# Patient Record
Sex: Female | Born: 1985 | Race: White | Hispanic: No | Marital: Single | Smoking: Never smoker
Health system: Southern US, Community
[De-identification: ages and names within clinical notes are randomized; demographics above are authoritative.]

## PROBLEM LIST (undated history)

## (undated) DIAGNOSIS — J45909 Unspecified asthma, uncomplicated: Secondary | ICD-10-CM

## (undated) DIAGNOSIS — O169 Unspecified maternal hypertension, unspecified trimester: Secondary | ICD-10-CM

---

## 2014-09-25 ENCOUNTER — Encounter (HOSPITAL_COMMUNITY): Payer: Self-pay

## 2014-09-25 ENCOUNTER — Emergency Department (HOSPITAL_COMMUNITY): Payer: Medicaid Other

## 2014-09-25 ENCOUNTER — Emergency Department (HOSPITAL_COMMUNITY)
Admission: EM | Admit: 2014-09-25 | Discharge: 2014-09-25 | Disposition: A | Discharge status: Home / Self Care | Payer: Medicaid Other | Attending: Emergency Medicine | Admitting: Emergency Medicine

## 2014-09-25 DIAGNOSIS — O10911 Unspecified pre-existing hypertension complicating pregnancy, first trimester: Secondary | ICD-10-CM | POA: Insufficient documentation

## 2014-09-25 DIAGNOSIS — O99511 Diseases of the respiratory system complicating pregnancy, first trimester: Secondary | ICD-10-CM | POA: Diagnosis not present

## 2014-09-25 DIAGNOSIS — Z79899 Other long term (current) drug therapy: Secondary | ICD-10-CM | POA: Diagnosis not present

## 2014-09-25 DIAGNOSIS — Z3A01 Less than 8 weeks gestation of pregnancy: Secondary | ICD-10-CM | POA: Insufficient documentation

## 2014-09-25 DIAGNOSIS — O039 Complete or unspecified spontaneous abortion without complication: Secondary | ICD-10-CM | POA: Insufficient documentation

## 2014-09-25 DIAGNOSIS — O219 Vomiting of pregnancy, unspecified: Secondary | ICD-10-CM | POA: Insufficient documentation

## 2014-09-25 DIAGNOSIS — N939 Abnormal uterine and vaginal bleeding, unspecified: Secondary | ICD-10-CM

## 2014-09-25 DIAGNOSIS — J45909 Unspecified asthma, uncomplicated: Secondary | ICD-10-CM | POA: Diagnosis not present

## 2014-09-25 DIAGNOSIS — O034 Incomplete spontaneous abortion without complication: Secondary | ICD-10-CM

## 2014-09-25 HISTORY — DX: Unspecified asthma, uncomplicated: J45.909

## 2014-09-25 HISTORY — DX: Unspecified maternal hypertension, unspecified trimester: O16.9

## 2014-09-25 LAB — CBC WITH DIFFERENTIAL/PLATELET
Basophils Absolute: 0 10*3/uL (ref 0.0–0.1)
Basophils Relative: 0 % (ref 0–1)
Eosinophils Absolute: 0.1 10*3/uL (ref 0.0–0.7)
Eosinophils Relative: 1 % (ref 0–5)
HEMATOCRIT: 37.6 % (ref 36.0–46.0)
HEMOGLOBIN: 13 g/dL (ref 12.0–15.0)
LYMPHS ABS: 2.7 10*3/uL (ref 0.7–4.0)
LYMPHS PCT: 28 % (ref 12–46)
MCH: 29.3 pg (ref 26.0–34.0)
MCHC: 34.6 g/dL (ref 30.0–36.0)
MCV: 84.9 fL (ref 78.0–100.0)
MONOS PCT: 5 % (ref 3–12)
Monocytes Absolute: 0.5 10*3/uL (ref 0.1–1.0)
NEUTROS PCT: 66 % (ref 43–77)
Neutro Abs: 6.3 10*3/uL (ref 1.7–7.7)
Platelets: 288 10*3/uL (ref 150–400)
RBC: 4.43 MIL/uL (ref 3.87–5.11)
RDW: 12.9 % (ref 11.5–15.5)
WBC: 9.6 10*3/uL (ref 4.0–10.5)

## 2014-09-25 LAB — WET PREP, GENITAL
CLUE CELLS WET PREP: NONE SEEN
TRICH WET PREP: NONE SEEN
WBC WET PREP: NONE SEEN
Yeast Wet Prep HPF POC: NONE SEEN

## 2014-09-25 LAB — ABO/RH: ABO/RH(D): A POS

## 2014-09-25 LAB — HCG, QUANTITATIVE, PREGNANCY: hCG, Beta Chain, Quant, S: 4451 m[IU]/mL — ABNORMAL HIGH (ref <5)

## 2014-09-25 NOTE — Discharge Instructions (Signed)
Your HCG quant today is 4451 which was resulted on 09/25/2014 at 7:41pm  Please maintain your follow-up with your primary doctor so that they can continue to follow your HCG and monitor your vaginal bleeding. If the bleeding significantly worsens, you develop significant abdominal pain or have other concerning findings return to the nearest emergency room for further evaluation.       US OB Limited (Final result) Result time: 09/25/14 22:43:16   Procedure changed from US Pelvis Complete      Final result by Rad Results In Interface (09/25/14 22:43:16)   Narrative:   CLINICAL DATA: Vaginal bleeding since 2 o'clock today.  EXAM: OBSTETRIC <14 WK [place] AND TRANSVAGINAL OB US  TECHNIQUE: Both transabdominal and transvaginal ultrasound examinations were performed for complete evaluation of the gestation as well as the maternal uterus, adnexal regions, and pelvic cul-de-sac. Transvaginal technique was performed to assess early pregnancy.  COMPARISON: None.  FINDINGS: Intrauterine gestational sac: Heterogeneous complex cystic structure in the endometrial cavity with any irregular contour.  Yolk sac: Not visualized  Embryo: Not visualized  Cardiac Activity: Not visualized  MSD: 19.1 mm  6 w  6 d  Maternal uterus/adnexae: No adnexal mass. Small hypoechoic area adjacent to the state back suggesting a a subchorionic hemorrhage. No adnexal mass. Left ovary is not visualized. Normal right ovary.  IMPRESSION: 1. Heterogeneous complex cystic structure in the endometrial cavity with irregular contour were. Findings are suspicious but not yet definitive for failed pregnancy. Recommend follow-up US in 10-14 days and serial beta HCG for definitive diagnosis. This recommendation follows SRU consensus guidelines: Diagnostic Criteria for Nonviable Pregnancy Early in the First Trimester. Malva Limes Med 2013; 542:7062-37.   Electronically Signed By: Elige Ko On:  09/25/2014 22:43          US OB Transvaginal (Final result) Result time: 09/25/14 22:43:16   Final result by Rad Results In Interface (09/25/14 22:43:16)   Narrative:   CLINICAL DATA: Vaginal bleeding since 2 o'clock today.  EXAM: OBSTETRIC <14 WK [place] AND TRANSVAGINAL OB US  TECHNIQUE: Both transabdominal and transvaginal ultrasound examinations were performed for complete evaluation of the gestation as well as the maternal uterus, adnexal regions, and pelvic cul-de-sac. Transvaginal technique was performed to assess early pregnancy.  COMPARISON: None.  FINDINGS: Intrauterine gestational sac: Heterogeneous complex cystic structure in the endometrial cavity with any irregular contour.  Yolk sac: Not visualized  Embryo: Not visualized  Cardiac Activity: Not visualized  MSD: 19.1 mm  6 w  6 d  Maternal uterus/adnexae: No adnexal mass. Small hypoechoic area adjacent to the state back suggesting a a subchorionic hemorrhage. No adnexal mass. Left ovary is not visualized. Normal right ovary.  IMPRESSION: 1. Heterogeneous complex cystic structure in the endometrial cavity with irregular contour were. Findings are suspicious but not yet definitive for failed pregnancy. Recommend follow-up US in 10-14 days and serial beta HCG for definitive diagnosis. This recommendation follows SRU consensus guidelines: Diagnostic Criteria for Nonviable Pregnancy Early in the First Trimester. Malva Limes Med 2013; 628:3151-76.

## 2014-09-25 NOTE — ED Notes (Signed)
Resident at bedside.  

## 2014-09-25 NOTE — ED Notes (Addendum)
Pt was helping sister move and is from [place]. Started passing clots today vaginally and is [redacted] weeks pregnant. Has been spotting this pregnancy which is 2nd pregnancy for her. Also reports some lower pelvic mid abd cramping. 1/10 pain scale. Has an appt tomorrow for u/s to determine EDD. States she has had some spotting when conceived and they are really sure when due date may be.

## 2014-09-25 NOTE — ED Provider Notes (Signed)
CSN: [id number]     Arrival date & time 09/25/14  1907 History   First MD Initiated Contact with Patient 09/25/14 2026     Chief Complaint  Patient presents with  . Vaginal Bleeding    [redacted] weeks pregnant     (Consider location/radiation/quality/duration/timing/severity/associated sxs/prior Treatment) Patient is a 29 y.o. female presenting with vaginal bleeding. The history is provided by the patient. No language interpreter was used.  Vaginal Bleeding Quality:  Clots Severity:  Moderate Onset quality:  Sudden Duration:  1 day Timing:  Intermittent Progression:  Worsening Chronicity:  New Possible pregnancy: yes (confirmed, followed by [facility] in [place])   Context: at rest and spontaneously   Relieved by:  None tried Worsened by:  Nothing tried Ineffective treatments:  None tried Associated symptoms: no abdominal pain, no dizziness, no dysuria, no fever, no nausea and no vaginal discharge   Risk factors: no hx of ectopic pregnancy, no new sexual partner and no prior miscarriage     Patient presents today with new onset vaginal bleeding that started over the last 1 week and worsened this afternoon. Patient relates prior to this afternoon she was having intermittent spotting. She states that today she started to feel like she was having one of her periods. She states that she is passing bright red clots currently. She denies any abdominal pain. Patient is notably confirmed pregnant and estimates her gestational age by last missed her period in mid June at 9 weeks. She denies any prior history of miscarriage and she is a G2 P1 pregnancy.  Past Medical History  Diagnosis Date  . Asthma   . Hypertension in pregnancy     1st pregnancy   History reviewed. No pertinent past surgical history. History reviewed. No pertinent family history. Social History  Substance Use Topics  . Smoking status: Never Smoker   . Smokeless tobacco: None  . Alcohol Use: No   OB History    Gravida Para Term Preterm AB TAB SAB Ectopic Multiple Living   2 1        1      Review of Systems  Constitutional: Negative for fever.  Gastrointestinal: Negative for nausea and abdominal pain.  Genitourinary: Positive for vaginal bleeding. Negative for dysuria and vaginal discharge.  Neurological: Negative for dizziness.      Allergies  Clindamycin/lincomycin  Home Medications   Prior to Admission medications   Medication Sig Start Date End Date Taking? Authorizing Provider  Prenatal Vit-Fe Fumarate-FA (PRENATAL PO) Take 1 tablet by mouth daily.   Yes Historical Provider, MD   BP 114/74 mmHg  Pulse 94  Temp(Src) 97.8 F (36.6 C) (Oral)  Resp 16  Ht 5\' 2"  (1.575 m)  Wt 193 lb 8 oz (87.771 kg)  BMI 35.38 kg/m2  SpO2 98% Physical Exam  Constitutional: She is oriented to person, place, and time. She appears well-developed and well-nourished. No distress.  HENT:  Head: Normocephalic and atraumatic.  Eyes: Conjunctivae and EOM are normal.  Neck: Normal range of motion. Neck supple.  Cardiovascular: Normal rate and regular rhythm.   No murmur heard. Pulmonary/Chest: Effort normal and breath sounds normal. No respiratory distress. She has no wheezes.  Abdominal: Soft. She exhibits no distension. There is no tenderness.  Genitourinary: Cervix exhibits no motion tenderness. Right adnexum displays no mass, no tenderness and no fullness. Left adnexum displays no mass, no tenderness and no fullness. There is bleeding (large clots) in the vagina. No tenderness in the vagina. No vaginal discharge  found.  Large amount of clots in vaginal vault. Some clots expressed from cervix. Cervix seems to be approximately 1 cm dilated on digital examination.  Musculoskeletal: Normal range of motion. She exhibits no edema.  Neurological: She is alert and oriented to person, place, and time.  Skin: Skin is warm and dry. She is not diaphoretic.  Psychiatric: She has a normal mood and affect. Her  behavior is normal.    ED Course  Procedures (including critical care time) Labs Review Labs Reviewed  HCG, QUANTITATIVE, PREGNANCY - Abnormal; Notable for the following:    hCG, Beta Chain, Quant, S 4451 (*)    All other components within normal limits  WET PREP, GENITAL  CBC WITH DIFFERENTIAL/PLATELET  ABO/RH  GC/CHLAMYDIA PROBE AMP ([place]) NOT AT [facility]    Imaging Review US Ob Limited  09/25/2014   CLINICAL DATA:  Vaginal bleeding since 2 o'clock today.  EXAM: OBSTETRIC <14 WK [place] AND TRANSVAGINAL OB US  TECHNIQUE: Both transabdominal and transvaginal ultrasound examinations were performed for complete evaluation of the gestation as well as the maternal uterus, adnexal regions, and pelvic cul-de-sac. Transvaginal technique was performed to assess early pregnancy.  COMPARISON:  None.  FINDINGS: Intrauterine gestational sac: Heterogeneous complex cystic structure in the endometrial cavity with any irregular contour.  Yolk sac:  Not visualized  Embryo:  Not visualized  Cardiac Activity: Not visualized  MSD: 19.1  mm   6 w   6  d  Maternal uterus/adnexae: No adnexal mass. Small hypoechoic area adjacent to the state back suggesting a a subchorionic hemorrhage. No adnexal mass. Left ovary is not visualized. Normal right ovary.  IMPRESSION: 1. Heterogeneous complex cystic structure in the endometrial cavity with irregular contour were. Findings are suspicious but not yet definitive for failed pregnancy. Recommend follow-up US in 10-14 days and serial beta HCG for definitive diagnosis. This recommendation follows SRU consensus guidelines: Diagnostic Criteria for Nonviable Pregnancy Early in the First Trimester. Malva Limes Med 2013; 161:0960-45.   Electronically Signed   By: Elige Ko   On: 09/25/2014 22:43   US Ob Transvaginal  09/25/2014   CLINICAL DATA:  Vaginal bleeding since 2 o'clock today.  EXAM: OBSTETRIC <14 WK [place] AND TRANSVAGINAL OB US  TECHNIQUE: Both transabdominal and transvaginal  ultrasound examinations were performed for complete evaluation of the gestation as well as the maternal uterus, adnexal regions, and pelvic cul-de-sac. Transvaginal technique was performed to assess early pregnancy.  COMPARISON:  None.  FINDINGS: Intrauterine gestational sac: Heterogeneous complex cystic structure in the endometrial cavity with any irregular contour.  Yolk sac:  Not visualized  Embryo:  Not visualized  Cardiac Activity: Not visualized  MSD: 19.1  mm   6 w   6  d  Maternal uterus/adnexae: No adnexal mass. Small hypoechoic area adjacent to the state back suggesting a a subchorionic hemorrhage. No adnexal mass. Left ovary is not visualized. Normal right ovary.  IMPRESSION: 1. Heterogeneous complex cystic structure in the endometrial cavity with irregular contour were. Findings are suspicious but not yet definitive for failed pregnancy. Recommend follow-up US in 10-14 days and serial beta HCG for definitive diagnosis. This recommendation follows SRU consensus guidelines: Diagnostic Criteria for Nonviable Pregnancy Early in the First Trimester. Malva Limes Med 2013; 409:8119-14.   Electronically Signed   By: Elige Ko   On: 09/25/2014 22:43   I have personally reviewed and evaluated these images and lab results as part of my medical decision-making.   EKG Interpretation  None      MDM   Final diagnoses:  Vaginal bleeding  Inevitable abortion    Patient presents today with vaginal bleeding that started today in the setting of known pregnancy. Ddx includes ectopic, threatened/inevitable abortion, infection. Pelvic exam appears consistent with inevitable abortion and os was approximately 1 cm dilated. Ultrasound shows findings concerning for this as well. Rh +, no indication for rhogam. Will get follow-up tomorrow with [facility] medicine in [place], [place] for further evaluation and trending of HCG which was notably between 4,000-5,000. Pt was HDS, no findings concerning for orthostatic  hypotension. No tachycardia. Pt comfortable with plan and aware of findings today. She was discharged home in good condition.     Madolyn Frieze, MD 09/26/14 1610  Dione Booze, MD 09/27/14 [phone]

## 2014-09-26 LAB — GC/CHLAMYDIA PROBE AMP (~~LOC~~) NOT AT ARMC
Chlamydia: NEGATIVE
NEISSERIA GONORRHEA: NEGATIVE

## 2016-04-29 IMAGING — US US OB LIMITED
1 series · 13 of 28 positions shown · non-contrast
Comparison: None.

CLINICAL DATA: Vaginal bleeding since 2 o'clock today.

EXAM:
OBSTETRIC <14 WK US AND TRANSVAGINAL OB US
TECHNIQUE: Both transabdominal and transvaginal ultrasound examinations were
performed for complete evaluation of the gestation as well as the
maternal uterus, adnexal regions, and pelvic cul-de-sac.
Transvaginal technique was performed to assess early pregnancy.

[Series 1: us ob limited · 0.20mm/px · 13 of 33 slices shown]
[im 2/33]
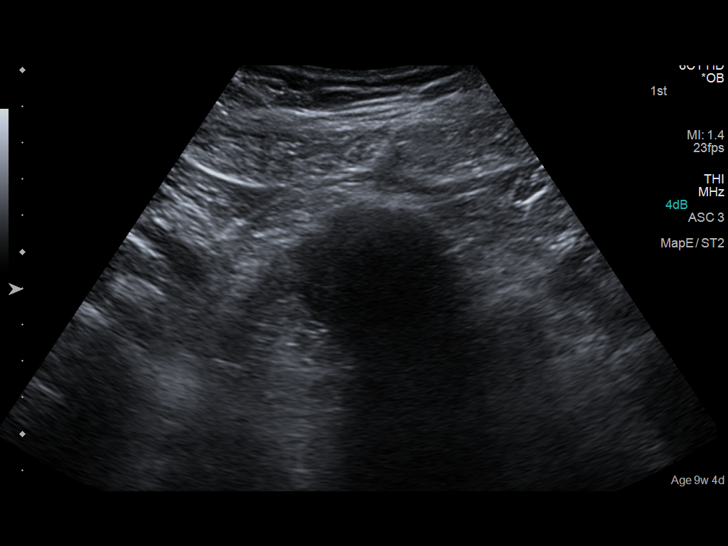
[im 4/33]
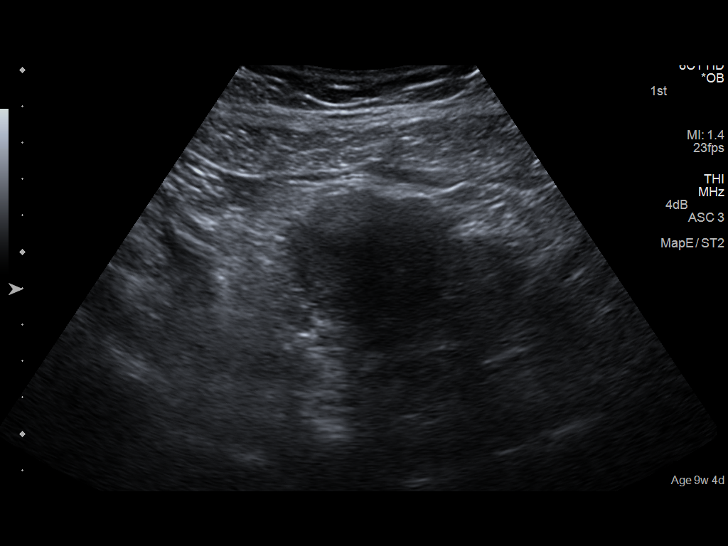
[im 6/33]
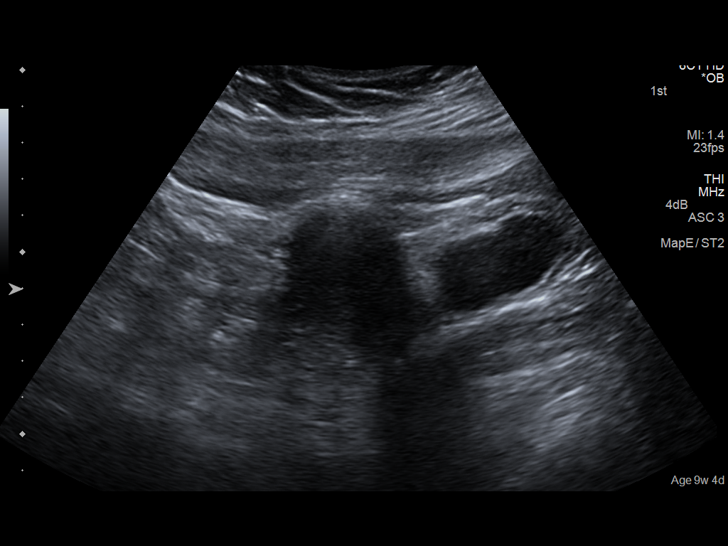
[im 9/33]
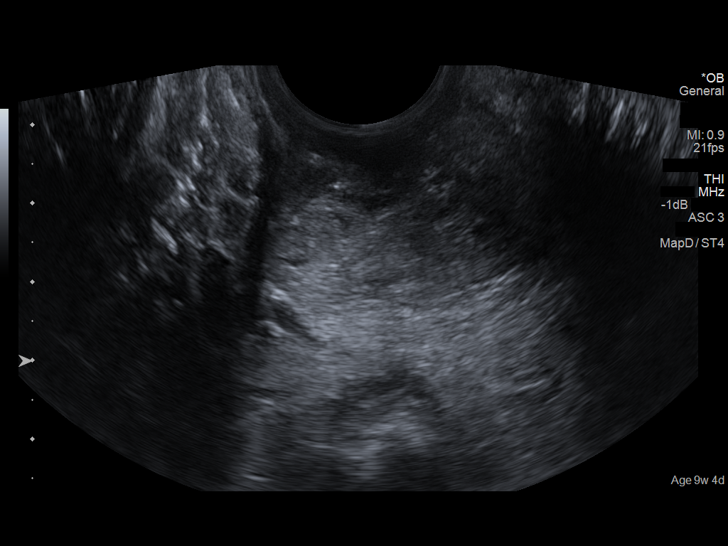
[im 11/33]
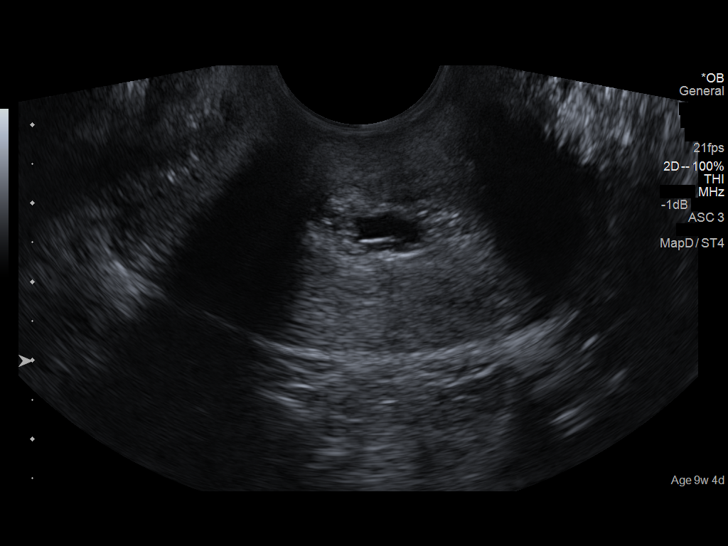
[im 14/33]
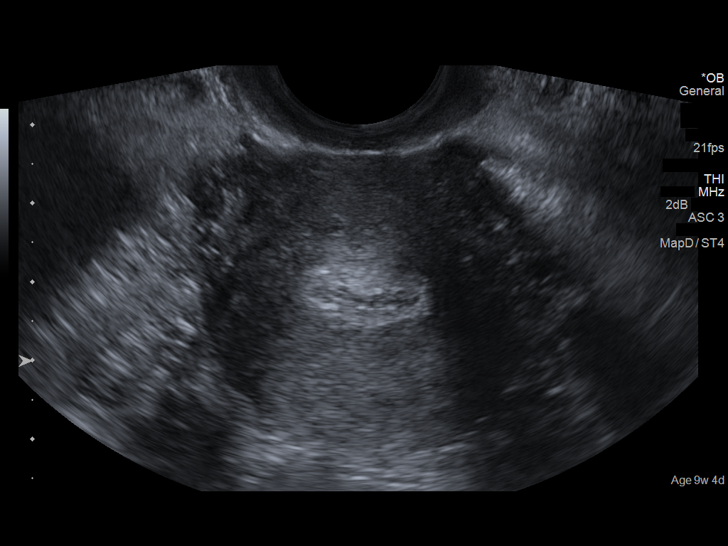
[im 17/33]
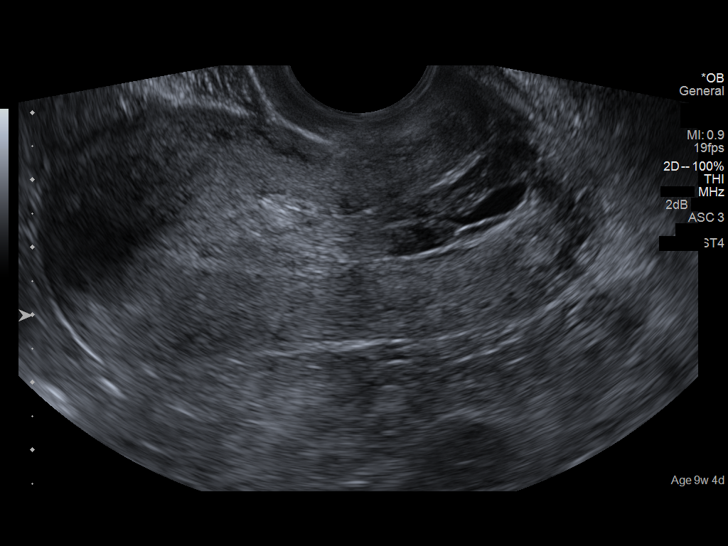
[im 19/33]
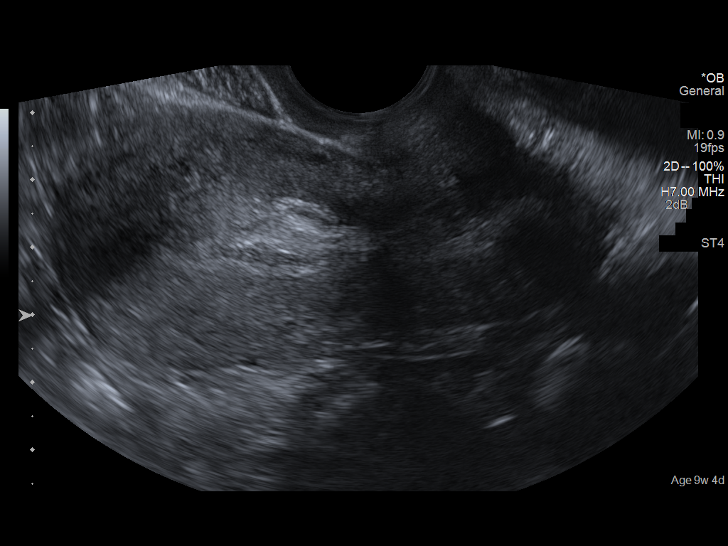
[im 22/33]
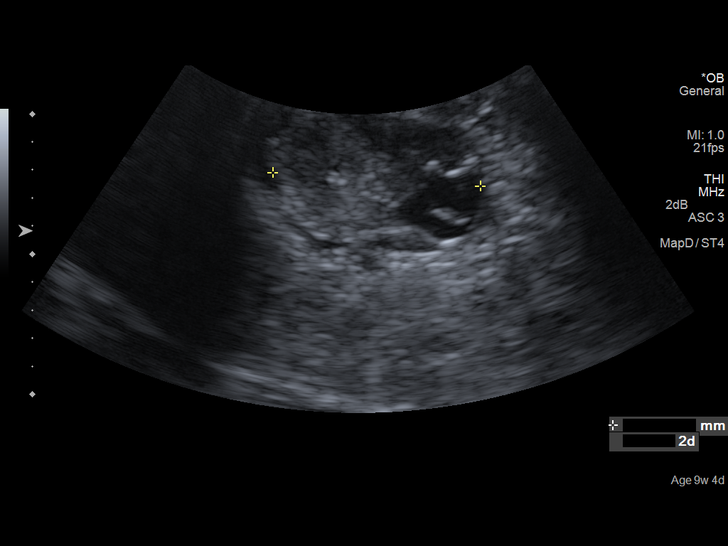
[im 24/33]
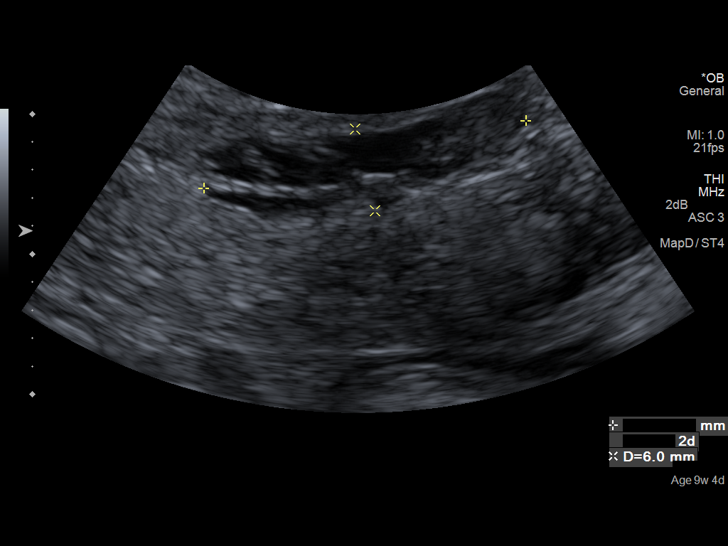
[im 27/33]
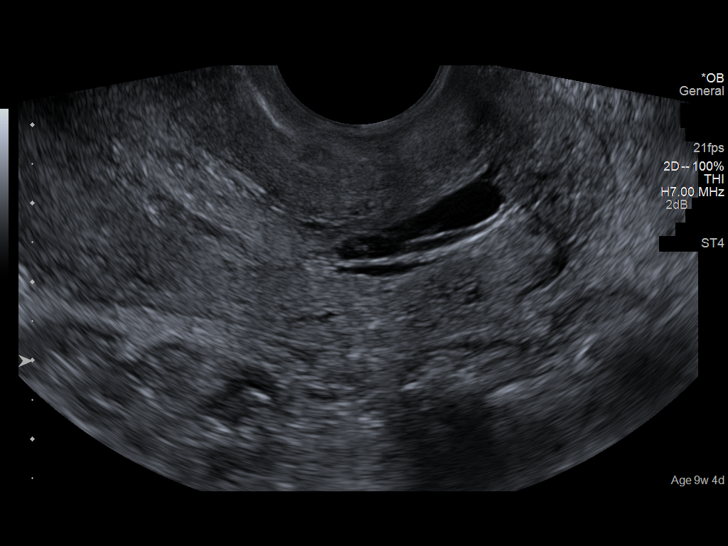
[im 29/33]
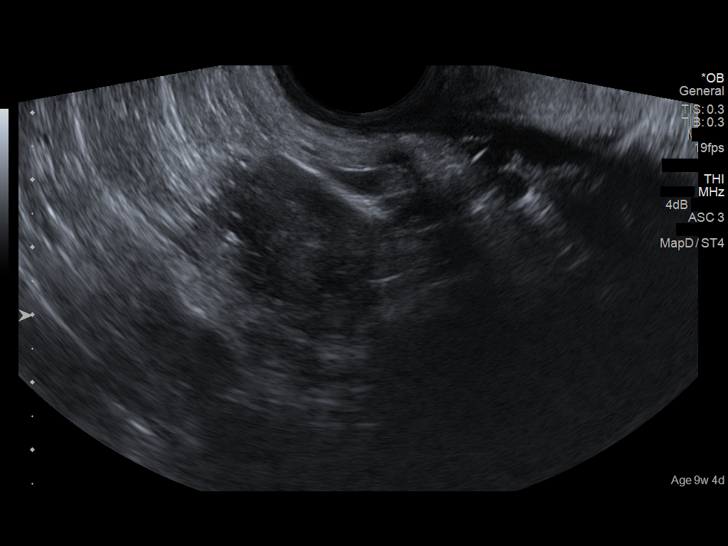
[im 31/33]
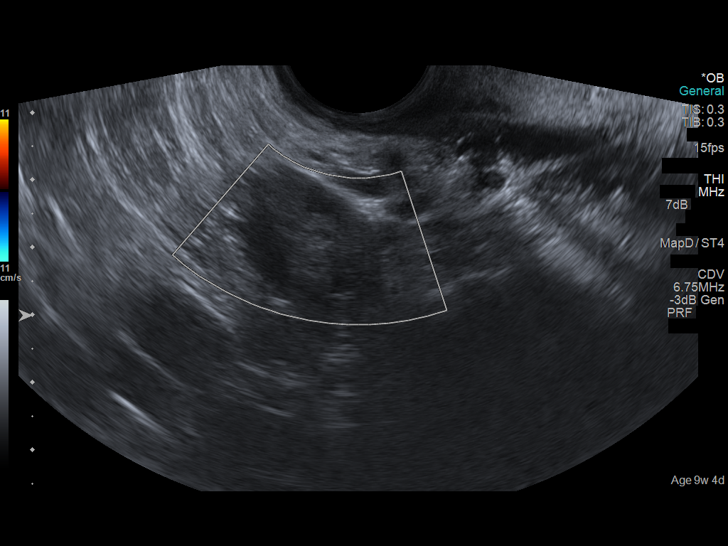

[13 of 28 positions shown; findings below may reference images not displayed]

FINDINGS: Intrauterine gestational sac: Heterogeneous complex cystic structure
in the endometrial cavity with any irregular contour.

Yolk sac:  Not visualized

Embryo:  Not visualized

Cardiac Activity: Not visualized

MSD: 19.1  mm   6 w   6  d

Maternal uterus/adnexae: No adnexal mass. Small hypoechoic area
adjacent to the state back suggesting a a subchorionic hemorrhage.
No adnexal mass. Left ovary is not visualized. Normal right ovary.
IMPRESSION: 1. Heterogeneous complex cystic structure in the endometrial cavity
with irregular contour were. Findings are suspicious but not yet
definitive for failed pregnancy. Recommend follow-up US in 10-14
days and serial beta HCG for definitive diagnosis. This
recommendation follows SRU consensus guidelines: Diagnostic Criteria
for Nonviable Pregnancy Early in the First Trimester. N Engl J Med
